# Patient Record
Sex: Female | Born: 1985 | Race: White | Hispanic: No | Marital: Married | Smoking: Current every day smoker
Health system: Southern US, Community
[De-identification: ages and names within clinical notes are randomized; demographics above are authoritative.]

---

## 2009-11-07 ENCOUNTER — Ambulatory Visit: Payer: Self-pay | Admitting: Occupational Medicine

## 2009-11-07 DIAGNOSIS — N760 Acute vaginitis: Secondary | ICD-10-CM | POA: Insufficient documentation

## 2009-11-07 LAB — CONVERTED CEMR LAB
Beta hcg, urine, semiquantitative: NEGATIVE
Bilirubin Urine: NEGATIVE
Ketones, urine, test strip: NEGATIVE
Nitrite: NEGATIVE

## 2009-11-08 ENCOUNTER — Encounter: Payer: Self-pay | Admitting: Occupational Medicine

## 2009-11-08 ENCOUNTER — Telehealth (INDEPENDENT_AMBULATORY_CARE_PROVIDER_SITE_OTHER): Payer: Self-pay | Admitting: *Deleted

## 2009-11-08 LAB — CONVERTED CEMR LAB: Chlamydia, DNA Probe: NEGATIVE

## 2009-11-09 LAB — CONVERTED CEMR LAB: Gardnerella vaginalis: POSITIVE — AB

## 2009-12-20 ENCOUNTER — Ambulatory Visit: Payer: Self-pay | Admitting: Family Medicine

## 2009-12-20 DIAGNOSIS — M545 Low back pain: Secondary | ICD-10-CM

## 2009-12-20 LAB — CONVERTED CEMR LAB
Blood in Urine, dipstick: NEGATIVE
Glucose, Urine, Semiquant: NEGATIVE
Protein, U semiquant: NEGATIVE
Specific Gravity, Urine: 1.015
Urobilinogen, UA: 2

## 2010-07-25 NOTE — Assessment & Plan Note (Signed)
Summary: Vaginal Discharge x 4 dys rm 3   Vital Signs:  Patient Profile:   25 Years Old Female CC:      Vaginal Discharge x 4 dys LMP:     10/14/2009 Height:     64 inches Weight:      117 pounds O2 Sat:      100 % O2 treatment:    Room Air Temp:     97.9 degrees F oral Pulse rate:   86 / minute Pulse rhythm:   regular Resp:     16 per minute BP sitting:   116 / 76  (right arm) Cuff size:   regular  Vitals Entered By: Areta Haber CMA (Nov 07, 2009 6:30 PM)  Menstrual History: LMP (date): 10/14/2009 LMP - Character: normal LMP - Reliable: No                  Current Allergies: No known allergies History of Present Illness Chief Complaint: Vaginal Discharge x 4 dys History of Present Illness: Presents with a yellow/green vaginal discharge for the last 4 days.  No fever. No abdominal pain.   Pregnancy test negative.   She is sexually active.  No reports of previous STD's.    Current Problems: VAGINITIS, BACTERIAL (ICD-616.10) FAMILY HISTORY OF CERVICAL CANCER (ICD-V17.3)   Current Meds AZITHROMYCIN 250 MG  TABS (AZITHROMYCIN) take all 4 pills at once for vaginal infection METRONIDAZOLE 500 MG TABS (METRONIDAZOLE) one two times a day for 7 days for vaginal infection  REVIEW OF SYSTEMS Constitutional Symptoms      Denies fever, chills, night sweats, weight loss, weight gain, and fatigue.  Eyes       Denies change in vision, eye pain, eye discharge, glasses, contact lenses, and eye surgery. Ear/Nose/Throat/Mouth       Denies hearing loss/aids, change in hearing, ear pain, ear discharge, dizziness, frequent runny nose, frequent nose bleeds, sinus problems, sore throat, hoarseness, and tooth pain or bleeding.  Respiratory       Denies dry cough, productive cough, wheezing, shortness of breath, asthma, bronchitis, and emphysema/COPD.  Cardiovascular       Denies murmurs, chest pain, and tires easily with exhertion.    Gastrointestinal       Denies stomach pain,  nausea/vomiting, diarrhea, constipation, blood in bowel movements, and indigestion. Genitourniary       Complains of blood or discharge from vagina.      Denies painful urination, kidney stones, and loss of urinary control.      Comments: Manson Passey, yellowishx 4 dys Neurological       Denies paralysis, seizures, and fainting/blackouts. Musculoskeletal       Denies muscle pain, joint pain, joint stiffness, decreased range of motion, redness, swelling, muscle weakness, and gout.  Skin       Denies bruising, unusual mles/lumps or sores, and hair/skin or nail changes.  Psych       Denies mood changes, temper/anger issues, anxiety/stress, speech problems, depression, and sleep problems. Other Comments: Pt does not have PCP   Past History:  Past Medical History: Unremarkable  Past Surgical History: Denies surgical history  Family History: Family History of Cervical cancer  Social History: Married Current Smoker - 1/2 pack daily Alcohol use-socially Drug use-no Regular exercise-no Smoking Status:  current Drug Use:  no Does Patient Exercise:  no Physical Exam General appearance: well developed, well nourished, no acute distress Abdomen: soft, non-tender without obvious organomegaly Speculum exam:  Cervix visualized.  Appears normal.   Thin  green/yellow discharge noted.   Eyrthmatous friable labia.   Assessment New Problems: VAGINITIS, BACTERIAL (ICD-616.10) FAMILY HISTORY OF CERVICAL CANCER (ICD-V17.3)   Plan New Medications/Changes: METRONIDAZOLE 500 MG TABS (METRONIDAZOLE) one two times a day for 7 days for vaginal infection  #14 x 0, 11/07/2009, Kathrine Haddock MD AZITHROMYCIN 250 MG  TABS (AZITHROMYCIN) take all 4 pills at once for vaginal infection  #4 x 0, 11/07/2009, Kathrine Haddock MD  New Orders: New Patient Level III [99203] T-Chlamydia & GC Probe, Genital [[id number]] T-Wet Prep by Molecular Probe [phone] Planning Comments:   Samples sent for GenProbe  and wet prep I will go ahead and treat empirically with azithromycin and flagyl Advised to have partner treated as well.  Follow up with PCP if symptoms do not improve   The patient and/or caregiver has been counseled thoroughly with regard to medications prescribed including dosage, schedule, interactions, rationale for use, and possible side effects and they verbalize understanding.  Diagnoses and expected course of recovery discussed and will return if not improved as expected or if the condition worsens. Patient and/or caregiver verbalized understanding.  Prescriptions: METRONIDAZOLE 500 MG TABS (METRONIDAZOLE) one two times a day for 7 days for vaginal infection  #14 x 0   Entered and Authorized by:   Kathrine Haddock MD   Signed by:   Kathrine Haddock MD on 11/07/2009   Method used:   Print then Give to Patient   RxID:   [id number] AZITHROMYCIN 250 MG  TABS (AZITHROMYCIN) take all 4 pills at once for vaginal infection  #4 x 0   Entered and Authorized by:   Kathrine Haddock MD   Signed by:   Kathrine Haddock MD on 11/07/2009   Method used:   Print then Give to Patient   RxID:   [id number]   Laboratory Results   Urine Tests  Date/Time Received: Nov 07, 2009 6:42 PM  Date/Time Reported: Nov 07, 2009 6:42 PM   Routine Urinalysis   Color: lt. yellow Appearance: Hazy Glucose: negative   (Normal Range: Negative) Bilirubin: negative   (Normal Range: Negative) Ketone: negative   (Normal Range: Negative) Spec. Gravity: 1.015   (Normal Range: 1.003-1.035) Blood: negative   (Normal Range: Negative) pH: 7.5   (Normal Range: 5.0-8.0) Protein: negative   (Normal Range: Negative) Urobilinogen: 0.2   (Normal Range: 0-1) Nitrite: negative   (Normal Range: Negative) Leukocyte Esterace: moderate   (Normal Range: Negative)    Urine HCG: negative

## 2010-07-25 NOTE — Letter (Signed)
Summary: Out of Work  [facility]  [address]   [place], [place] [address]   Phone: [phone]  Fax: [phone]    December 20, 2009   Employee:  FRANCELY CRAW    To Whom It May Concern:   For Medical reasons, Rubina should avoid heavy lifting (not over 20 pounds) for one week.   If you need additional information, please feel free to contact our office.         Sincerely,    Donna Christen MD

## 2010-07-25 NOTE — Assessment & Plan Note (Signed)
Summary: LBP x 1 wk rm 3   Vital Signs:  Patient Profile:   25 Years Old Female CC:      LBP x 1 wk LMP:     11/13/2009 Height:     64 inches Weight:      122 pounds O2 Sat:      99 % O2 treatment:    Room Air Temp:     98.0 degrees F oral Pulse rate:   91 / minute Pulse rhythm:   regular Resp:     18 per minute BP sitting:   116 / 74  (right arm) Cuff size:   regular  Vitals Entered By: Areta Haber CMA (December 20, 2009 7:31 PM)  Menstrual History: LMP (date): 11/13/2009                  Current Allergies: No known allergies History of Present Illness Chief Complaint: LBP x 1 wk History of Present Illness: Subjective:  Patient complains of one week history of mild low back pain, non-radiating.  She recalls no recent injury or significant change in physical activities, although she does lift at work. Back pain is worse at night and with movement.  No urinary or bowel symptoms.  No abdominal or pelvic pain.  No fevers/sweats, although she may have had some chills today.  No saddle numbness.   Last menstrual period was 11/13/09.  Current Problems: LOW BACK PAIN, ACUTE (ICD-724.2) VAGINITIS, BACTERIAL (ICD-616.10) FAMILY HISTORY OF CERVICAL CANCER (ICD-V17.3)   Current Meds TRAMADOL HCL 50 MG TABS (TRAMADOL HCL) One or two tabs by mouth hs as needed for pain  REVIEW OF SYSTEMS Constitutional Symptoms      Denies fever, chills, night sweats, weight loss, weight gain, and fatigue.  Eyes       Complains of eye pain.      Denies change in vision, eye discharge, glasses, contact lenses, and eye surgery.      Comments: L side Ear/Nose/Throat/Mouth       Complains of ear pain, frequent runny nose, and sinus problems.      Denies hearing loss/aids, change in hearing, ear discharge, dizziness, frequent nose bleeds, sore throat, hoarseness, and tooth pain or bleeding.      Comments: R  , clear x today Respiratory       Complains of dry cough.      Denies productive cough,  wheezing, shortness of breath, asthma, bronchitis, and emphysema/COPD.  Cardiovascular       Denies murmurs, chest pain, and tires easily with exhertion.    Gastrointestinal       Complains of nausea/vomiting.      Denies stomach pain, diarrhea, constipation, blood in bowel movements, and indigestion.      Comments: x today Genitourniary       Denies painful urination, kidney stones, and loss of urinary control. Neurological       Complains of headaches.      Denies paralysis, seizures, and fainting/blackouts. Musculoskeletal       Complains of muscle pain and decreased range of motion.      Denies joint pain, joint stiffness, redness, swelling, muscle weakness, and gout.      Comments: LBPx 1 wk Skin       Denies bruising, unusual mles/lumps or sores, and hair/skin or nail changes.  Psych       Denies mood changes, temper/anger issues, anxiety/stress, speech problems, depression, and sleep problems. Other Comments: Pt has no PCP. Pt states she has  not injuries her back anyway but stands on both jobs.    Past History:  Past Medical History: Last updated: 11/07/2009 Unremarkable  Past Surgical History: Last updated: 11/07/2009 Denies surgical history  Family History: Last updated: 11/07/2009 Family History of Cervical cancer  Social History: Last updated: 11/07/2009 Married Current Smoker - 1/2 pack daily Alcohol use-socially Drug use-no Regular exercise-no  Risk Factors: Exercise: no (11/07/2009)  Risk Factors: Smoking Status: current (11/07/2009)   Objective:  Appearance:  Patient appears healthy, stated age, and in no acute distress  Eyes:  Pupils are equal, round, and reactive to light and accomdation.  Extraocular movement is intact.  Conjunctivae are not inflamed.  Pharynx:  Normal  Neck:  Supple.  No adenopathy is present.  No thyromegaly is present  Lungs:  Clear to auscultation.  Breath sounds are equal.  Heart:  Regular rate and rhythm without murmurs,  rubs, or gallops.  Abdomen:  Nontender without masses or hepatosplenomegaly.  Bowel sounds are present.  No CVA or flank tenderness.   Back:  Full range of motion.  Very mild tenderness bilateral sacral area.  Straight leg raising test is negative.  Sitting knee extension test is negative.  Strength and sensation in the lower extremities is normal.  Patellar and achilles reflexes are normal.  urinalysis (dipstick):  negative Urine pregnancy test negative Assessment New Problems: LOW BACK PAIN, ACUTE (ICD-724.2)  MUSCULOSKELETAL LOW BACK PAIN  Plan New Medications/Changes: TRAMADOL HCL 50 MG TABS (TRAMADOL HCL) One or two tabs by mouth hs as needed for pain  #15 x 0, 12/20/2009, Donna Christen MD  New Orders: Est. Patient Level III [[address]] Urinalysis [60454-09811] Urine Pregnancy [CPT-81025] Planning Comments:   Treat symptomatically for now:  Ibuprofen 200mg , 3 or 4 tabs every 8 hours with food  Analgesic for bedtime.  Begin back exercises ([facility] information and instruction patient handout given).  Avoid heavy lifting; discussed proper lifting techniques. Follow-up with PCP if not improving 2 weeks.  Return for worsening symptoms   The patient and/or caregiver has been counseled thoroughly with regard to medications prescribed including dosage, schedule, interactions, rationale for use, and possible side effects and they verbalize understanding.  Diagnoses and expected course of recovery discussed and will return if not improved as expected or if the condition worsens. Patient and/or caregiver verbalized understanding.  Prescriptions: TRAMADOL HCL 50 MG TABS (TRAMADOL HCL) One or two tabs by mouth hs as needed for pain  #15 x 0   Entered and Authorized by:   Donna Christen MD   Signed by:   Donna Christen MD on 12/20/2009   Method used:   Print then Give to Patient   RxID:   [id number]   Orders Added: 1)  Est. Patient Level III [78469] 2)  Urinalysis [81003-65000] 3)   Urine Pregnancy [CPT-81025]  Laboratory Results   Urine Tests  Date/Time Received: December 20, 2009 8:17 PM  Date/Time Reported: December 20, 2009 8:17 PM   Routine Urinalysis   Color: yellow Appearance: Hazy Glucose: negative   (Normal Range: Negative) Bilirubin: negative   (Normal Range: Negative) Ketone: trace (5)   (Normal Range: Negative) Spec. Gravity: 1.015   (Normal Range: 1.003-1.035) Blood: negative   (Normal Range: Negative) pH: 7.0   (Normal Range: 5.0-8.0) Protein: negative   (Normal Range: Negative) Urobilinogen: 2.0   (Normal Range: 0-1) Nitrite: negative   (Normal Range: Negative) Leukocyte Esterace: negative   (Normal Range: Negative)    Urine HCG: negative

## 2010-07-25 NOTE — Progress Notes (Signed)
  Phone Note Call from Patient   Caller: Patient Action Taken: Phone Call Completed Summary of Call:  Patient would like a call from the nurse. She needs to ask a question. Also she would like the results from her test results. [facility] Initial call taken by: Shelbie Proctor,  Nov 08, 2009 4:42 PM     Called patient got message that patient was unavailable.  Unable to leave message.   Spoke to patient advised her to finish meds.

## 2011-08-10 ENCOUNTER — Emergency Department
Admission: EM | Admit: 2011-08-10 | Discharge: 2011-08-10 | Disposition: A | Discharge status: Home / Self Care | Payer: Self-pay | Source: Home / Self Care | Attending: Family Medicine | Admitting: Family Medicine

## 2011-08-10 ENCOUNTER — Emergency Department: Admit: 2011-08-10 | Discharge: 2011-08-10 | Disposition: A | Discharge status: Home / Self Care | Payer: Self-pay

## 2011-08-10 DIAGNOSIS — M25469 Effusion, unspecified knee: Secondary | ICD-10-CM

## 2011-08-10 DIAGNOSIS — M7632 Iliotibial band syndrome, left leg: Secondary | ICD-10-CM

## 2011-08-10 DIAGNOSIS — M629 Disorder of muscle, unspecified: Secondary | ICD-10-CM

## 2011-08-10 DIAGNOSIS — M25569 Pain in unspecified knee: Secondary | ICD-10-CM

## 2011-08-10 NOTE — ED Notes (Signed)
Left knee pain started 2 weeks ago w/out known injury. Also noted lump on outter side of knee. Increase pain with bending the states she feels like she can not straighten it back out.

## 2011-08-10 NOTE — ED Provider Notes (Signed)
History     CSN: [id number]  Arrival date & time 08/10/11  1728   First MD Initiated Contact with Patient 08/10/11 1833      Chief Complaint  Patient presents with  . Joint Swelling    LT knee    HPI Comments: Patient complains of pain and swelling in her left lateral knee for about 2.5 weeks.  She recalls no trauma to the knee.  Prior to onset of pain she had been doing knee extension exercises but has discontinued.  Her left knee does not lock or give way.  Patient is a 26 y.o. female presenting with knee pain. The history is provided by the patient.  Knee Pain This is a new problem. Episode onset: 2.5 weeks. The problem occurs constantly. The problem has been gradually worsening. Pertinent negatives include no chest pain and no shortness of breath. The symptoms are aggravated by bending and standing (squatting). The symptoms are relieved by nothing. She has tried a cold compress (ibuprofen) for the symptoms. The treatment provided no relief.    History reviewed. No pertinent past medical history.  History reviewed. No pertinent past surgical history.  History reviewed. No pertinent family history.  History  Substance Use Topics  . Smoking status: Not on file  . Smokeless tobacco: Not on file  . Alcohol Use: Yes    OB History    Grav Para Term Preterm Abortions TAB SAB Ect Mult Living                  Review of Systems  Respiratory: Negative for shortness of breath.   Cardiovascular: Negative for chest pain.  All other systems reviewed and are negative.    Allergies  Review of patient's allergies indicates no known allergies.  Home Medications  No current outpatient prescriptions on file.  BP 121/82  Pulse 80  Temp(Src) 98.3 F (36.8 C) (Oral)  Resp 20  Ht 5\' 4"  (1.626 m)  Wt 126 lb (57.153 kg)  BMI 21.63 kg/m2  SpO2 98%  LMP 07/23/2011  Physical Exam  Nursing note and vitals reviewed. Constitutional: She appears well-developed and well-nourished. No  distress.  Musculoskeletal:       Left knee: She exhibits swelling. She exhibits no effusion, no ecchymosis, no deformity, no laceration, no erythema, normal alignment, no LCL laxity, normal patellar mobility, no bony tenderness, normal meniscus and no MCL laxity. tenderness found. Lateral joint line tenderness noted. No medial joint line, no MCL, no LCL and no patellar tendon tenderness noted.       Legs:      Left knee has tenderness and mild swelling laterally.  No pain elicited with valgus and varus stress.  Negative McMurray and Grind maneuvers.    ED Course  Procedures  none  Labs Reviewed - No data to display Dg Knee Complete 4 Views Left  08/10/2011  *RADIOLOGY REPORT*  Clinical Data: Pain without trauma.  LEFT KNEE - COMPLETE 4+ VIEW  Comparison: None.  Findings: No acute fracture or dislocation.  Joint spaces are maintained.  No joint effusion.  IMPRESSION: Normal left knee.  Original Report Authenticated By: Consuello Bossier, M.D.     1. Iliotibial band syndrome of left side       MDM    Begin applying ice pack several times daily.  Begin Ibuprofen 200mg , 4 tabs every 8 hours with food.  In about 5 days, begin stretching and range of motion exercises as per instruction sheet ([facility] information and  instruction handout given)  Avoid athletic activities that involve flexing and extension of knees. Followup with [facility] if not improved 3 weeks.       Donna Christen, MD 08/11/11 [phone]

## 2011-08-10 NOTE — Discharge Instructions (Signed)
Begin applying ice pack several times daily.  Begin Ibuprofen 200mg , 4 tabs every 8 hours with food.  In about 5 days, begin stretching and range of motion exercises as per instruction sheet. Avoid athletic activities that involve flexing and extension of knees.

## 2013-01-07 IMAGING — CR DG KNEE COMPLETE 4+V*L*
4 series · 4 of 4 positions shown · non-contrast
Comparison: None.

CLINICAL DATA: Pain without trauma.

LEFT KNEE - COMPLETE 4+ VIEW

[view not recorded (1 of 4)]
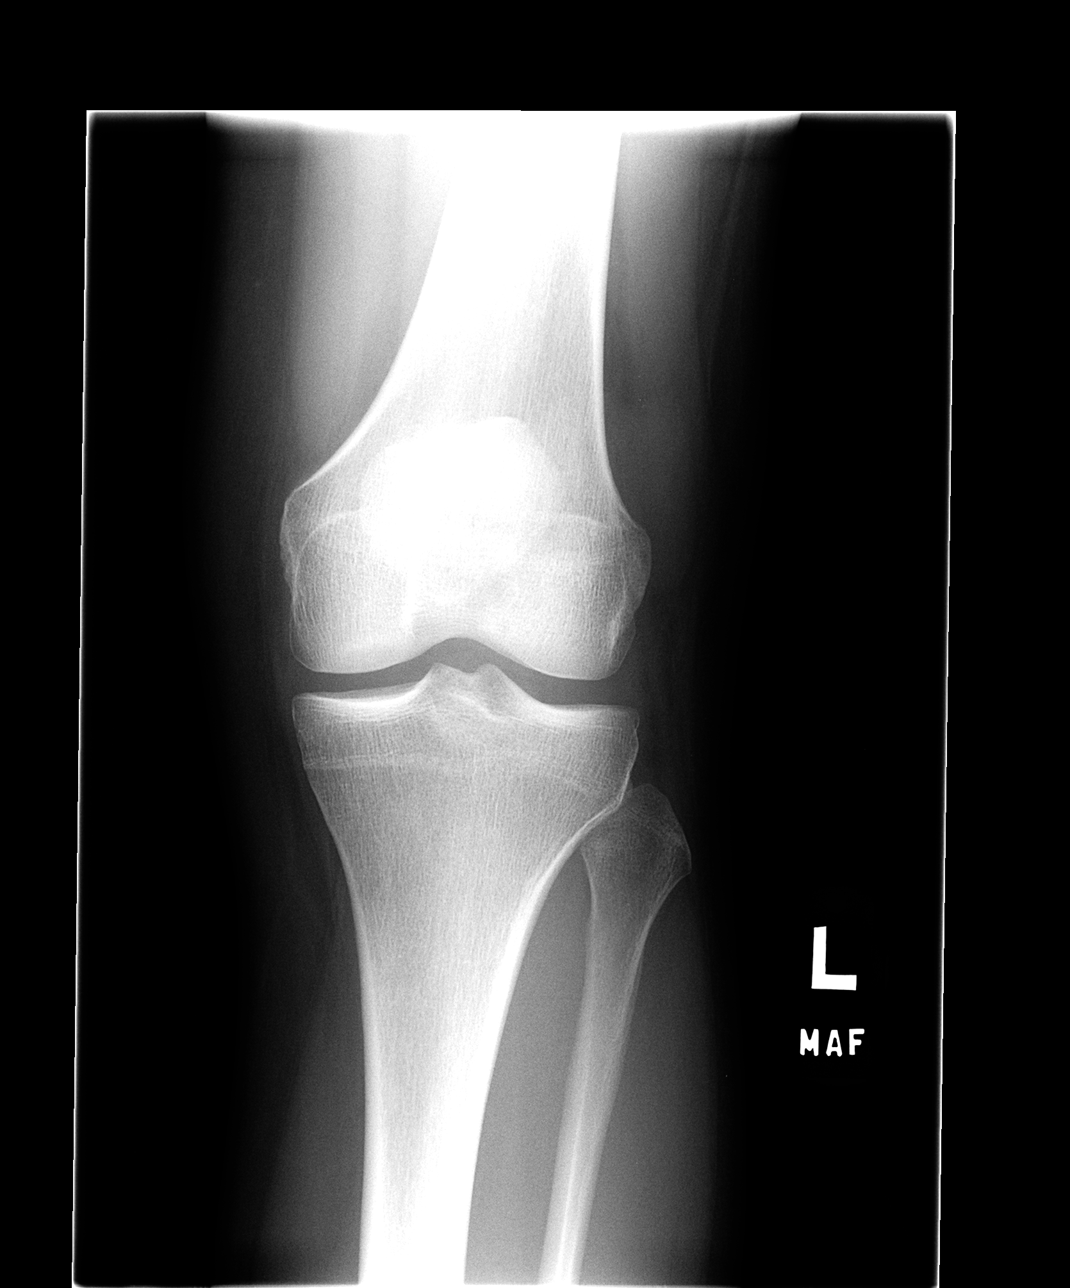

[view not recorded (2 of 4)]
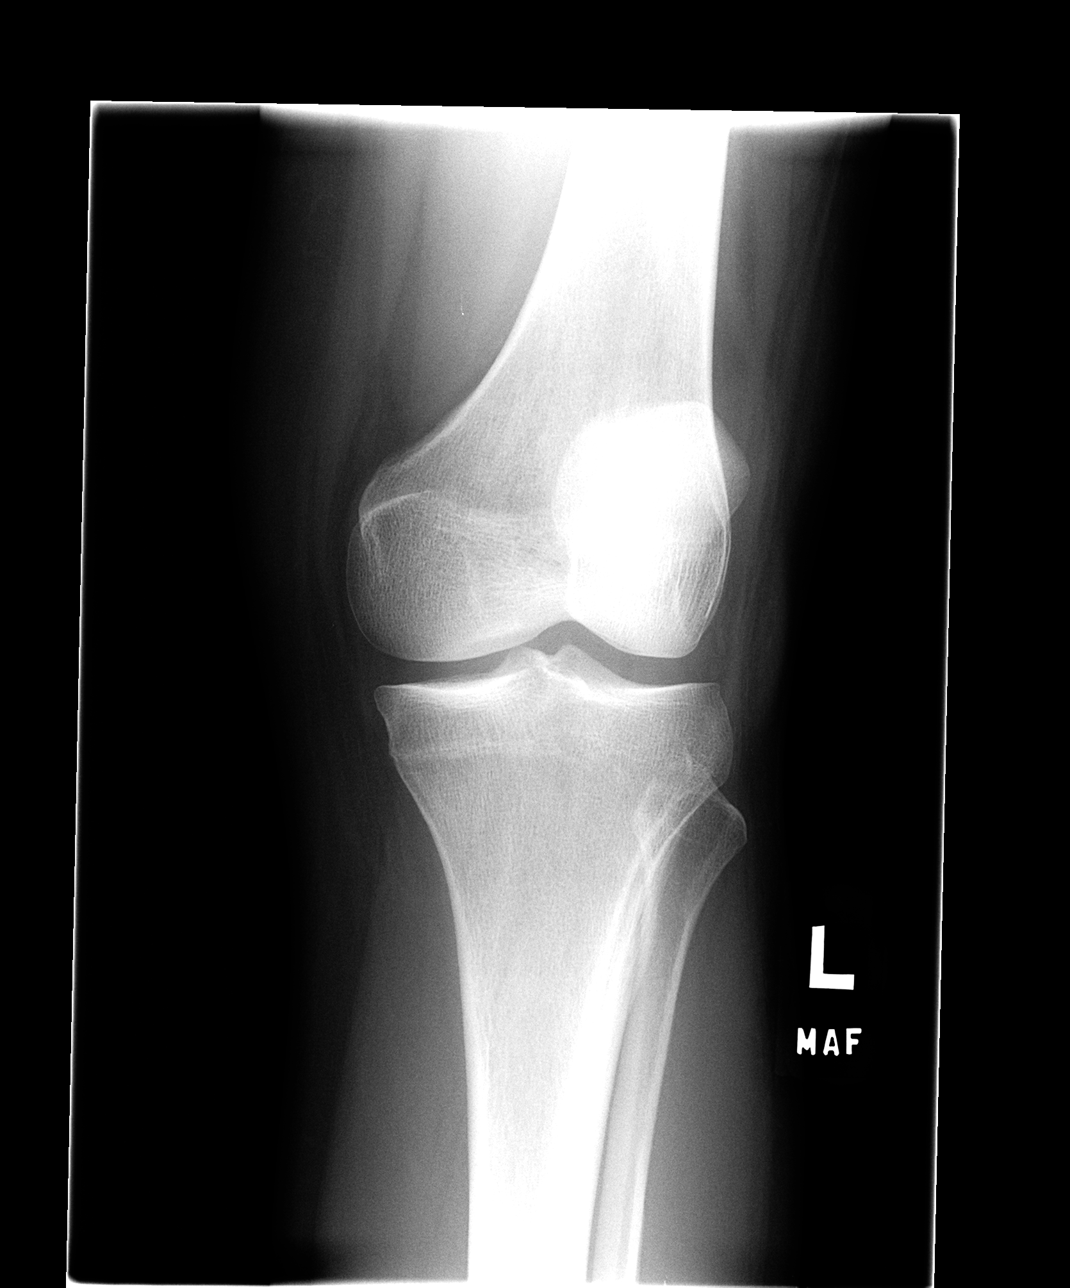

[view not recorded (3 of 4)]
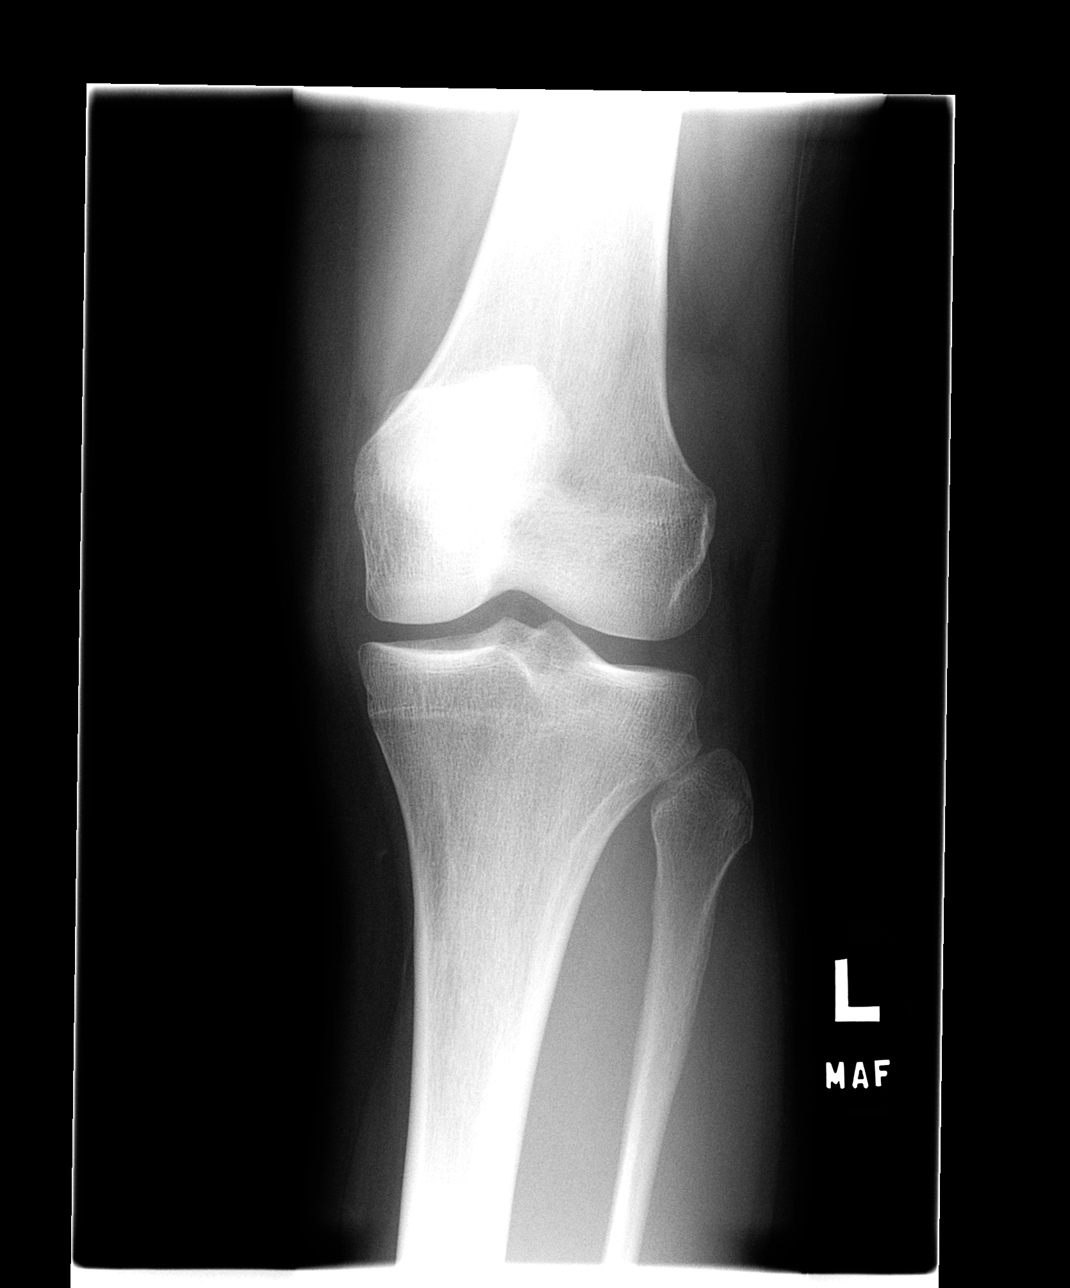

[view not recorded (4 of 4)]
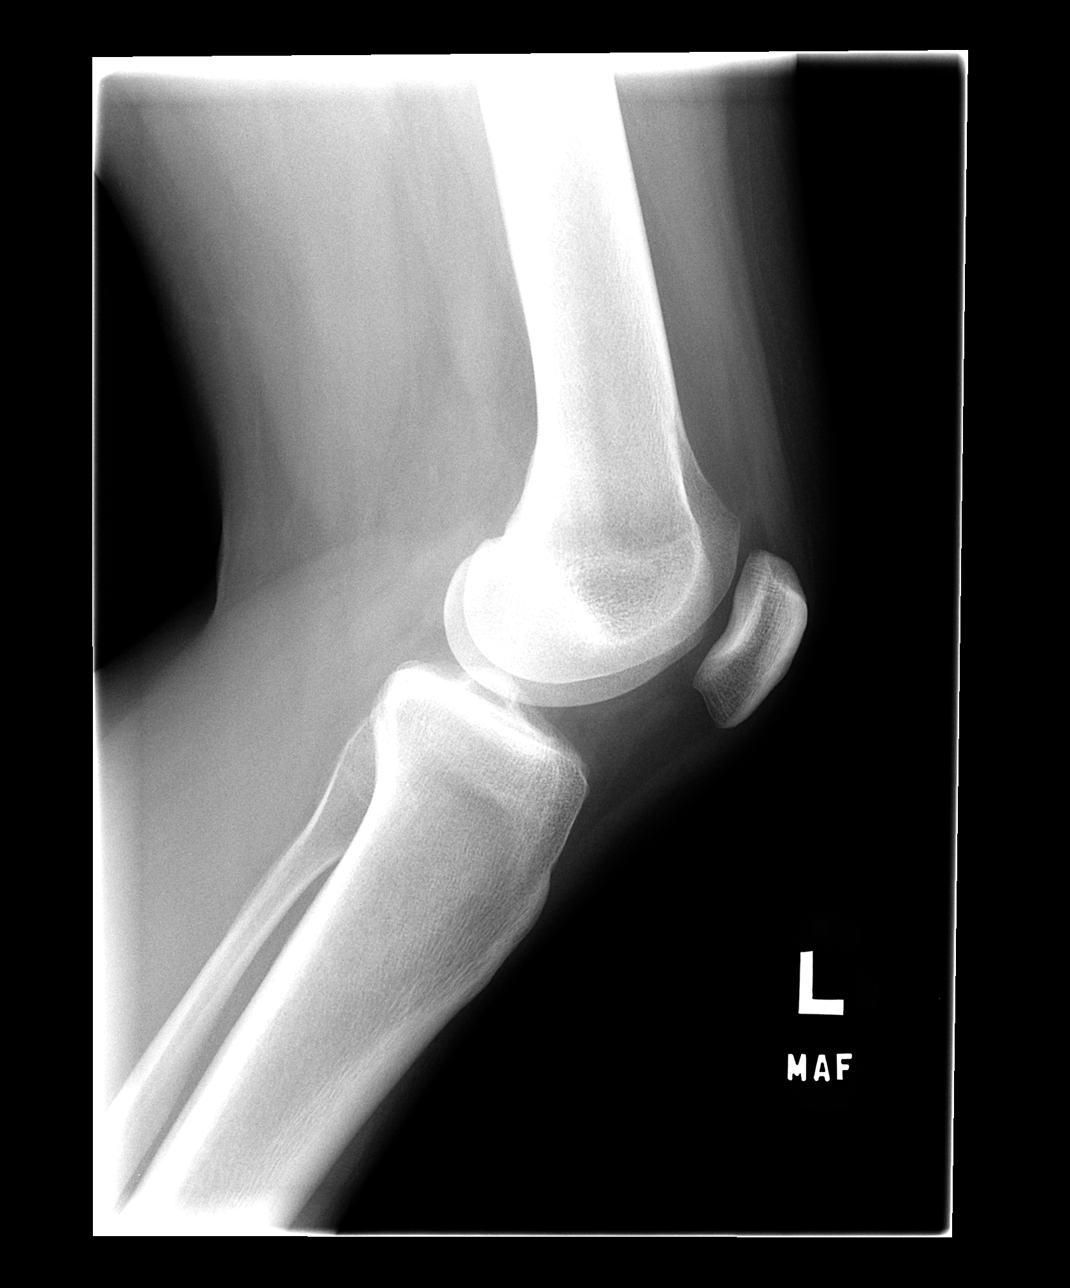

[4 of 4 positions shown; findings below may reference images not displayed]

FINDINGS: No acute fracture or dislocation.  Joint spaces are
maintained.  No joint effusion.
IMPRESSION: Normal left knee.

## 2013-06-05 ENCOUNTER — Emergency Department
Admission: EM | Admit: 2013-06-05 | Discharge: 2013-06-05 | Disposition: A | Discharge status: Home / Self Care | Payer: Self-pay | Source: Home / Self Care | Attending: Family Medicine | Admitting: Family Medicine

## 2013-06-05 ENCOUNTER — Encounter: Payer: Self-pay | Admitting: Emergency Medicine

## 2013-06-05 DIAGNOSIS — R300 Dysuria: Secondary | ICD-10-CM

## 2013-06-05 LAB — POCT CBC W AUTO DIFF (K'VILLE URGENT CARE)

## 2013-06-05 LAB — POCT URINALYSIS DIP (MANUAL ENTRY)
Bilirubin, UA: NEGATIVE
Glucose, UA: NEGATIVE
Ketones, POC UA: NEGATIVE
Nitrite, UA: NEGATIVE
pH, UA: 6 (ref 5–8)

## 2013-06-05 LAB — POCT URINE PREGNANCY: Preg Test, Ur: NEGATIVE

## 2013-06-05 MED ORDER — CEPHALEXIN 500 MG PO CAPS: 500.0000 mg | ORAL_CAPSULE | Freq: Two times a day (BID) | ORAL | Status: DC

## 2013-06-05 NOTE — ED Provider Notes (Addendum)
CSN: [id number]     Arrival date & time 06/05/13  1049 History   First MD Initiated Contact with Patient 06/05/13 1115     Chief Complaint  Patient presents with  . Abdominal Pain  . Back Pain  . Dysuria  . Urinary Frequency  . Bloated      HPI Comments: Patient states that she underwent artificial insemination on 05/25/13.  Ten days ago she developed vague lower abdominal discomfort and mild dysuria.  Over the past two days she has increased lower abdominal discomfort radiating to her back, and several episodes of dysuria, frequency, and hesitancy.  She has felt fatigued, but no fevers, chills, and sweats.  She has had several episodes of nausea.  She has had some loose bowel movements but no diarrhea.  She has had scant vaginal discharge but this is not new. She checked a home urinalysis kit last night and today which indicated UTI.  She has had two home pregnancy tests that were negative.  Patient is a 27 y.o. female presenting with dysuria. The history is provided by the patient.  Dysuria Pain quality:  Burning Pain severity:  Mild Onset quality:  Sudden Duration:  1 week Timing:  Intermittent Progression:  Unchanged Chronicity:  New Recent urinary tract infections: no   Relieved by:  Nothing Worsened by:  Nothing tried Ineffective treatments:  None tried Urinary symptoms: frequent urination and hesitancy   Urinary symptoms: no discolored urine, no foul-smelling urine, no hematuria and no bladder incontinence   Associated symptoms: abdominal pain, nausea, vaginal discharge and vomiting   Associated symptoms: no fever, no flank pain and no genital lesions   Risk factors: sexually active     History reviewed. No pertinent past medical history. History reviewed. No pertinent past surgical history. Family History  Problem Relation Age of Onset  . Diabetes Mother   . Hypertension Mother   . Cancer Mother     cervical   . Anemia Father    History  Substance Use Topics  .  Smoking status: Current Every Day Smoker -- 1.50 packs/day  . Smokeless tobacco: Not on file  . Alcohol Use: No   OB History   Grav Para Term Preterm Abortions TAB SAB Ect Mult Living                 Review of Systems  Constitutional: Negative for fever.  Gastrointestinal: Positive for nausea, vomiting and abdominal pain.  Genitourinary: Positive for dysuria and vaginal discharge. Negative for flank pain.    Allergies  Review of patient's allergies indicates no known allergies.  Home Medications  No current outpatient prescriptions on file. BP 112/75  Pulse 102  Temp(Src) 97.9 F (36.6 C) (Oral)  Resp 16  Ht 5\' 6"  (1.676 m)  Wt 137 lb (62.143 kg)  BMI 22.12 kg/m2  SpO2 99%  LMP 05/12/2013 Physical Exam Nursing notes and Vital Signs reviewed. Appearance:  Patient appears healthy, stated age, and in no acute distress Eyes:  Pupils are equal, round, and reactive to light and accomodation.  Extraocular movement is intact.  Conjunctivae are not inflamed  Pharynx:  Normal; moist mucous membranes  Neck:  Supple.  No adenopathy Lungs:  Clear to auscultation.  Breath sounds are equal.  Heart:  Regular rate and rhythm without murmurs, rubs, or gallops.  Abdomen:  Minimal tenderness over bladder without masses or hepatosplenomegaly.  Bowel sounds are present.  No CVA or flank tenderness.  Extremities:  No edema.  No calf tenderness  Skin:  No rash present.   ED Course  Procedures  none    Labs Reviewed  URINE CULTURE  POCT URINALYSIS DIP (MANUAL ENTRY):  SG >= 1.030; LEU small, otherwise negative  POCT URINE PREGNANCY negative POCT CBC:  WBC 4.5; LY 36.5; MO 2.4; GR 61.1; Hgb 13.7;  Platelets 241          MDM   1. Dysuria    Urine culture pending.  Begin Keftlex. Increase fluid intake.   If symptoms become significantly worse during the night or over the weekend, proceed to the local emergency room.     Lattie Haw, MD 06/05/13 1152  Lattie Haw,  MD 06/05/13 [phone]

## 2013-06-05 NOTE — ED Notes (Signed)
Pt c/o LBP, lower abd pain, bloated, fatigue, dysuria, and urinary frequency x 1wk. She reports having artificial insemination on 05/25/13.

## 2013-06-07 ENCOUNTER — Telehealth: Payer: Self-pay

## 2013-06-07 LAB — URINE CULTURE
Colony Count: NO GROWTH
Organism ID, Bacteria: NO GROWTH

## 2013-06-07 NOTE — ED Notes (Signed)
Left a message on voice mail asking how patient is feeling and advising to call back with any questions or concerns. Also, labs are normal.

## 2014-08-16 ENCOUNTER — Encounter: Payer: Self-pay | Admitting: Physician Assistant

## 2014-08-16 ENCOUNTER — Ambulatory Visit (INDEPENDENT_AMBULATORY_CARE_PROVIDER_SITE_OTHER): Payer: Managed Care, Other (non HMO) | Admitting: Physician Assistant

## 2014-08-16 VITALS — BP 113/72 | HR 84 | Ht 66.0 in | Wt 145.0 lb

## 2014-08-16 DIAGNOSIS — F4323 Adjustment disorder with mixed anxiety and depressed mood: Secondary | ICD-10-CM

## 2014-08-16 DIAGNOSIS — Z7251 High risk heterosexual behavior: Secondary | ICD-10-CM | POA: Insufficient documentation

## 2014-08-16 DIAGNOSIS — Z3002 Counseling and instruction in natural family planning to avoid pregnancy: Secondary | ICD-10-CM | POA: Insufficient documentation

## 2014-08-16 MED ORDER — CITALOPRAM HYDROBROMIDE 10 MG PO TABS: 10.0000 mg | ORAL_TABLET | Freq: Every day | ORAL | Status: AC

## 2014-08-16 MED ORDER — LORAZEPAM 0.5 MG PO TABS: 0.5000 mg | ORAL_TABLET | Freq: Two times a day (BID) | ORAL | Status: AC | PRN

## 2014-08-16 MED ORDER — NORETHINDRONE ACET-ETHINYL EST 1-20 MG-MCG PO TABS: 1.0000 | ORAL_TABLET | Freq: Every day | ORAL | Status: AC
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
## 2014-08-17 ENCOUNTER — Encounter: Payer: Self-pay | Admitting: Physician Assistant

## 2014-08-17 DIAGNOSIS — F4323 Adjustment disorder with mixed anxiety and depressed mood: Secondary | ICD-10-CM | POA: Insufficient documentation

## 2014-08-17 LAB — HCG, SERUM, QUALITATIVE: Preg, Serum: NEGATIVE
